# Patient Record
Sex: Female | Born: 2005 | Race: White | Hispanic: Yes | Marital: Single | Smoking: Never smoker
Health system: Southern US, Community
[De-identification: ages and names within clinical notes are randomized; demographics above are authoritative.]

---
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
## 2025-01-10 ENCOUNTER — Encounter (HOSPITAL_BASED_OUTPATIENT_CLINIC_OR_DEPARTMENT_OTHER): Payer: Self-pay | Admitting: Emergency Medicine

## 2025-01-10 ENCOUNTER — Emergency Department (HOSPITAL_BASED_OUTPATIENT_CLINIC_OR_DEPARTMENT_OTHER): Admission: EM | Admit: 2025-01-10 | Discharge: 2025-01-10 | Disposition: A | Discharge status: Home / Self Care

## 2025-01-10 ENCOUNTER — Other Ambulatory Visit: Payer: Self-pay

## 2025-01-10 DIAGNOSIS — N946 Dysmenorrhea, unspecified: Secondary | ICD-10-CM | POA: Insufficient documentation

## 2025-01-10 DIAGNOSIS — J45909 Unspecified asthma, uncomplicated: Secondary | ICD-10-CM | POA: Diagnosis not present

## 2025-01-10 LAB — CBC WITH DIFFERENTIAL/PLATELET
Abs Immature Granulocytes: 0.05 10*3/uL (ref 0.00–0.07)
Basophils Absolute: 0 10*3/uL (ref 0.0–0.1)
Basophils Relative: 0 %
Eosinophils Absolute: 0.1 10*3/uL (ref 0.0–0.5)
Eosinophils Relative: 2 %
HCT: 35.3 % — ABNORMAL LOW (ref 36.0–46.0)
Hemoglobin: 12.1 g/dL (ref 12.0–15.0)
Immature Granulocytes: 1 %
Lymphocytes Relative: 32 %
Lymphs Abs: 2 10*3/uL (ref 0.7–4.0)
MCH: 29.4 pg (ref 26.0–34.0)
MCHC: 34.3 g/dL (ref 30.0–36.0)
MCV: 85.7 fL (ref 80.0–100.0)
Monocytes Absolute: 0.5 10*3/uL (ref 0.1–1.0)
Monocytes Relative: 7 %
Neutro Abs: 3.6 10*3/uL (ref 1.7–7.7)
Neutrophils Relative %: 58 %
Platelets: 253 10*3/uL (ref 150–400)
RBC: 4.12 MIL/uL (ref 3.87–5.11)
RDW: 12.1 % (ref 11.5–15.5)
WBC: 6.2 10*3/uL (ref 4.0–10.5)
nRBC: 0 % (ref 0.0–0.2)

## 2025-01-10 LAB — URINALYSIS, ROUTINE W REFLEX MICROSCOPIC
Bacteria, UA: NONE SEEN
Bilirubin Urine: NEGATIVE
Glucose, UA: NEGATIVE mg/dL
Ketones, ur: NEGATIVE mg/dL
Nitrite: NEGATIVE
RBC / HPF: >50 RBC/hpf (ref 0–5)
Specific Gravity, Urine: 1.021 (ref 1.005–1.030)
pH: 6 (ref 5.0–8.0)

## 2025-01-10 LAB — COMPREHENSIVE METABOLIC PANEL WITH GFR
ALT: 10 U/L (ref 0–44)
AST: 17 U/L (ref 15–41)
Albumin: 4.6 g/dL (ref 3.5–5.0)
Alkaline Phosphatase: 59 U/L (ref 38–126)
Anion gap: 12 (ref 5–15)
BUN: 10 mg/dL (ref 6–20)
CO2: 25 mmol/L (ref 22–32)
Calcium: 10 mg/dL (ref 8.9–10.3)
Chloride: 104 mmol/L (ref 98–111)
Creatinine, Ser: 0.56 mg/dL (ref 0.44–1.00)
GFR, Estimated: >60 mL/min
Glucose, Bld: 97 mg/dL (ref 70–99)
Potassium: 3.8 mmol/L (ref 3.5–5.1)
Sodium: 141 mmol/L (ref 135–145)
Total Bilirubin: 0.3 mg/dL (ref 0.0–1.2)
Total Protein: 7.2 g/dL (ref 6.5–8.1)

## 2025-01-10 LAB — MAGNESIUM: Magnesium: 2.2 mg/dL (ref 1.7–2.4)

## 2025-01-10 LAB — PREGNANCY, URINE: Preg Test, Ur: NEGATIVE

## 2025-01-10 MED ORDER — KETOROLAC TROMETHAMINE 15 MG/ML IJ SOLN
15.0000 mg | Freq: Once | INTRAMUSCULAR | Status: AC
  Administered 2025-01-10: 15 mg via INTRAVENOUS
  Filled 2025-01-10: qty 1

## 2025-01-10 MED ORDER — ETODOLAC 400 MG PO TABS: 400.0000 mg | ORAL_TABLET | Freq: Two times a day (BID) | ORAL | 0 refills | Status: AC

## 2025-01-10 MED ORDER — SODIUM CHLORIDE 0.9 % IV BOLUS
1000.0000 mL | Freq: Once | INTRAVENOUS | Status: AC
  Administered 2025-01-10: 1000 mL via INTRAVENOUS

## 2025-01-10 NOTE — ED Provider Notes (Signed)
 " [facility] EMERGENCY DEPARTMENT AT [facility] Provider Note   CSN: [id number] Arrival date & time: 01/10/25  9150     Patient presents with: Abdominal Cramping   Tracie Patterson is a 19 y.o. female.   19 year old female presents with her mom for concern of menstrual cramps that have been ongoing for the past 3 days that are causing intense pain.  She states this has been at a problem since she started having cycles.  She states every year they have been getting more intense.  She had notified her pediatrician who told her that these were normal in the past.  However she states the past several cycles her cramps have been pretty significant.  The cycle that started 3 days ago she has been passing blood clots.  She does endorse weakness, cramps, and lightheadedness.  No chest pain.  She does not have a GYN.  Her mom states that she was diagnosed with endometriosis when she was 19 years old.  The patient denies vaginal discharge, being sexually active, fever, emesis.  Her abdominal cramps are located across the lower abdomen.  The history is provided by the patient. No language interpreter was used.       Prior to Admission medications  Medication Sig Start Date End Date Taking? Authorizing Provider  adapalene  (DIFFERIN ) 0.1 % cream Apply topically at bedtime. Patient not taking: Reported on 11/17/2023 10/19/22   Gabriella Arthor GAILS, MD  Adapalene -Benzoyl Peroxide 0.1-2.5 % gel Apply 1 Application topically at bedtime. 11/28/24   Gabriella Arthor GAILS, MD  azelastine  (OPTIVAR ) 0.05 % ophthalmic solution Place 1 drop into both eyes daily. 11/21/24   Gabriella Arthor GAILS, MD  cetirizine  (ZYRTEC ) 10 MG tablet Take 1 tablet (10 mg total) by mouth daily. PRN Patient not taking: Reported on 11/17/2023 01/20/23   Gabriella Arthor GAILS, MD  Cholecalciferol (VITAMIN D ) 50 MCG (2000 UT) CAPS Take 1 capsule (2,000 Units total) by mouth daily. Start after completing 8 weeks of high dose Vitamin D  treatment  11/30/23   Gabriella Arthor GAILS, MD  hydrOXYzine  (ATARAX ) 10 MG tablet Take 1 tablet (10 mg total) by mouth 3 (three) times daily as needed. 11/17/23   Shropshire, Beatriz, DO  minocycline  (MINOCIN ) 100 MG capsule Take 1 capsule (100 mg total) by mouth 2 (two) times daily. 11/21/24   Simha, Arthor GAILS, MD  Vitamin D , Ergocalciferol , (DRISDOL ) 1.25 MG (50000 UNIT) CAPS capsule Take 1 capsule (50,000 Units total) by mouth every 7 (seven) days. 11/30/23   Gabriella Arthor GAILS, MD    Allergies: Patient has no known allergies.    Review of Systems  Constitutional:  Negative for chills and fever.  Respiratory:  Negative for shortness of breath.   Cardiovascular:  Negative for chest pain.  Gastrointestinal:  Positive for abdominal pain. Negative for nausea and vomiting.  Genitourinary:  Positive for vaginal bleeding. Negative for difficulty urinating, dysuria and vaginal discharge.  Neurological:  Positive for light-headedness.  All other systems reviewed and are negative.   Updated Vital Signs BP 116/72   Pulse 63   Temp 98.3 F (36.8 C) (Oral)   Resp 18   Ht 5' 4 (1.626 m)   Wt 72.6 kg   LMP 01/08/2025 (Exact Date)   SpO2 100%   BMI 27.46 kg/m   Physical Exam Vitals and nursing note reviewed.  Constitutional:      General: She is not in acute distress.    Appearance: Normal appearance. She is not ill-appearing.  HENT:  Head: Normocephalic and atraumatic.  Abdominal:     General: There is no distension.     Palpations: Abdomen is soft.     Tenderness: There is abdominal tenderness. There is no right CVA tenderness, left CVA tenderness or guarding.  Neurological:     Mental Status: She is alert.     (all labs ordered are listed, but only abnormal results are displayed) Labs Reviewed  URINALYSIS, ROUTINE W REFLEX MICROSCOPIC - Abnormal; Notable for the following components:      Result Value   Color, Urine ORANGE (*)    APPearance HAZY (*)    Hgb urine dipstick LARGE (*)     Protein, ur TRACE (*)    Leukocytes,Ua TRACE (*)    All other components within normal limits  PREGNANCY, URINE  CBC WITH DIFFERENTIAL/PLATELET  COMPREHENSIVE METABOLIC PANEL WITH GFR  MAGNESIUM    EKG: None  Radiology: No results found.   Procedures   Medications Ordered in the ED  sodium chloride  0.9 % bolus 1,000 mL (has no administration in time range)  ketorolac  (TORADOL ) 15 MG/ML injection 15 mg (has no administration in time range)                                    Medical Decision Making Amount and/or Complexity of Data Reviewed Labs: ordered.  Risk Prescription drug management.   Medical Decision Making / ED Course   This patient presents to the ED for concern of abdominal pain, this involves an extensive number of treatment options, and is a complaint that carries with it a high risk of complications and morbidity.  The differential diagnosis includes menstrual cramps, ovarian torsion, UTI, pyelonephritis, colitis, gastroenteritis, appendicitis, endometriosis, PCOS  MDM: 19 year old female presents with above image complaints.  Overall she is currently well-appearing.  Does have tenderness across the lower abdomen.  UA without evidence of UTI but does show some hemoglobin which is consistent with her menstrual cycle. Will provide fluids, Toradol , obtain blood work.  Pregnancy test is negative.  She does have history of irregular cycles.  She notes that she has been going through 5 pads a day this cycle.  Normally she goes through 3 pads a day.  CBC without leukocytosis or anemia.  CMP unremarkable.  Magnesium within normal. Patient feels improved after fluids and Toradol .  No fevers or chills given the duration of symptoms and that this similar pain occurs every menstrual cycle this is likely menstrual cramp pain.  She is comfortable on reevaluation.  We discussed close follow-up with GYN.  We discussed strict return precautions specially signs or symptoms that  may be worrisome for ovarian torsion.  Patient voices understanding and she is in agreement with the plan.  Patient discharged in stable condition.   Additional history obtained: -Additional history obtained from mom at bedside -External records from outside source obtained and reviewed including: Chart review including previous notes, labs, imaging, consultation notes   Lab Tests: -I ordered, reviewed, and interpreted labs.   The pertinent results include:   Labs Reviewed  URINALYSIS, ROUTINE W REFLEX MICROSCOPIC - Abnormal; Notable for the following components:      Result Value   Color, Urine ORANGE (*)    APPearance HAZY (*)    Hgb urine dipstick LARGE (*)    Protein, ur TRACE (*)    Leukocytes,Ua TRACE (*)    All other components within normal limits  PREGNANCY, URINE  CBC WITH DIFFERENTIAL/PLATELET  COMPREHENSIVE METABOLIC PANEL WITH GFR  MAGNESIUM      EKG  EKG Interpretation Date/Time:    Ventricular Rate:    PR Interval:    QRS Duration:    QT Interval:    QTC Calculation:   R Axis:      Text Interpretation:           Imaging Studies ordered: I ordered imaging studies including none I independently visualized and interpreted imaging. I agree with the radiologist interpretation   Medicines ordered and prescription drug management: Meds ordered this encounter  Medications   sodium chloride  0.9 % bolus 1,000 mL   ketorolac  (TORADOL ) 15 MG/ML injection 15 mg    -I have reviewed the patients home medicines and have made adjustments as needed  Social Determinants of Health:  Factors impacting patients care include: no gyn follow up. Referral given   Reevaluation: After the interventions noted above, I reevaluated the patient and found that they have :improved  Co morbidities that complicate the patient evaluation  Past Medical History:  Diagnosis Date   Asthma       Dispostion: Discharged in stable condition.  Return precaution discussed.   Patient voices understanding and is in agreement with plan.  Final diagnoses:  Severe menstrual cramps    ED Discharge Orders          Ordered    etodolac  (LODINE ) 400 MG tablet  2 times daily        01/10/25 1050               Hildegard Loge, [place] 01/10/25 1056    Simon Lavonia SAILOR, MD 01/10/25 1225  "

## 2025-01-10 NOTE — ED Triage Notes (Signed)
 Pt caox4, ambulatory reporting she started her menstrual cycle 3 days ago and that the lower abd cramping feels worse than it does with her previous menstrual cycles. Also reports she has had more clots this times with some nausea.

## 2025-01-10 NOTE — Discharge Instructions (Signed)
 Blood work in the emergency department was reassuring.  You received a dose of Toradol  which is an anti-inflammatory medication which helped your pain.  I will send Lodine  which is an anti-inflammatory medication into the pharmacy.  Do not take ibuprofen  or Aleve while you are taking this medication.  If this medication is expensive do not pick this up and take Aleve 500 mg twice a day instead.  Please follow-up with the GYN.  I have listed their office information above.  Return for any worsening

## 2025-01-16 ENCOUNTER — Ambulatory Visit: Admitting: Pediatrics

## 2025-01-16 VITALS — Temp 98.4°F | Wt 163.4 lb

## 2025-01-16 DIAGNOSIS — N946 Dysmenorrhea, unspecified: Secondary | ICD-10-CM | POA: Diagnosis not present

## 2025-01-16 DIAGNOSIS — L709 Acne, unspecified: Secondary | ICD-10-CM | POA: Diagnosis not present

## 2025-01-16 MED ORDER — ADAPALENE 0.3 % EX GEL: 1.0000 | Freq: Every day | CUTANEOUS | 6 refills | Status: AC

## 2025-01-16 MED ORDER — MINOCYCLINE HCL 100 MG PO CAPS: 100.0000 mg | ORAL_CAPSULE | Freq: Two times a day (BID) | ORAL | 3 refills | Status: AC

## 2025-01-16 MED ORDER — NAPROXEN 375 MG PO TABS: 375.0000 mg | ORAL_TABLET | Freq: Two times a day (BID) | ORAL | 3 refills | Status: AC

## 2025-01-16 NOTE — Patient Instructions (Signed)
 Cramps During Your Menstrual Period Cramps during your period is called dysmenorrhea. The cramps cause pain in your lower belly. In some cases, the pain may not be that bad. In other cases, the pain may be so bad that it can affect your daily life for a few days each month. There're two types of this condition: Primary. This happens when a female has their first monthly period or soon after. As a person gets older or has a baby, the cramps usually lessen or go away. Secondary. This type begins later in life. It's caused by a problem in the reproductive system. This type lasts longer. The pain may also be worse than in primary type. What are the causes? Primary dysmenorrhea is caused by hormone changes during your period.  Common causes of secondary dysmenorrhea include: Problems with the tissue that lines the uterus. This tissue may: Grow outside the uterus. Grow into the walls of the uterus. Growths in the uterus that are not cancer (fibroids). Problems with the reproductive organs. These may include problems with the uterus, fallopian tubes, or ovaries. Infection. What increases the risk? You are more likely to get this condition if: You are younger than 19 years old. You had your first period before you were 19 years old. You have irregular or heavy bleeding. You have never given birth. You have family members with the condition. You smoke or use nicotine products. You have high body weight or a low body weight. What are the signs or symptoms? Symptoms of this condition may include: Cramps and pain in the lower belly or lower back. Headaches. Throwing up or feeling like you may throw up. Watery poop (diarrhea) or loose poop. Feeling dizzy. Feeling tired. How is this diagnosed? This condition may be diagnosed based on your symptoms, medical history, and a physical exam. You may also have other tests, including: Imaging tests, such as ultrasound, X-ray, CT scan, or MRI. Blood  tests. A pregnancy test. A pap test. Taking out and testing a small piece of the tissue that lines the uterus. A procedure that uses a device with a camera to look inside the uterus, pelvis, or bladder. How is this treated? Treatment depends on the cause of your condition. You may be given: Medicines for pain. Hormones. These include shots to stop your periods. You may also get hormones through birth control pills or an IUD. Antidepressant medicines. Antibiotics. These are given if your cramps are being caused by an infection. Other treatment may include: Exercise and physical therapy. Meditation, yoga, and acupuncture. Stimulating the nerves that go to the bottom of the spine. If other treatments don't work, your health care provider may suggest having surgery. This is rare. Work with your provider to see which treatment is best for you. Follow these instructions at home: Relieving pain and cramping  Use heat as told. Put the heat on your lower back or belly when you have pain or cramps. Use the heat source that your provider recommends, such as a moist heat pack or a heating pad. Do this as often as told. Place a towel between your skin and the heat source. Leave the heat on for 20-30 minutes. If your skin turns red, take off the heat right away to prevent burns. The risk of burns is higher if you can't feel pain, heat, or cold. Do not sleep with a heating pad on. Exercise as told. Activities such as walking, swimming, or biking can help to relieve pain. Massage your lower back  or belly to help relieve pain. General instructions Take your medicines only as told. Ask your provider if it's safe to drive or use machines while taking your medicine. Avoid alcohol and caffeine during and right before your period. These can make cramps worse. Do not smoke, vape, or use nicotine or tobacco. Keep all follow-up visits. Your provider can change your treatment plan if treatment isn't  working. Contact a health care provider if: You have symptoms that get worse or do not get better with medicine. You have new symptoms. This information is not intended to replace advice given to you by your health care provider. Make sure you discuss any questions you have with your health care provider. Document Revised: 10/11/2023 Document Reviewed: 05/24/2023 Elsevier Patient Education  2024 [employer].

## 2025-01-16 NOTE — Progress Notes (Signed)
 "   Subjective:    Tracie Patterson is a 19 y.o. female a presenting to the clinic today for ER follow-up for dysmenorrhea.  Patient was seen in the emergency room on 01/10/2025 for severe menstrual cramps that was not responding to over-the-counter ibuprofen  and Midol .  Patient had significant cramping as well as leg pain and was not able to walk due to the pain.  She received IV fluids and Toradol  in the ER which seem to relieve her pain.  She had normal CMP and CBC and negative pregnancy test during her ER visit. Patient reports that her pain has resolved since she has completed her menstrual cycle but it seems that overall her menstrual cramps have worsened over time and she often has to miss school or work when she has significant menstrual cramping.  Usually uses ibuprofen  or Midol  at home but that has not been helpful.  There have been several discussions regarding hormone therapy in the past but patient has been very hesitant.  She is not sexually active.  Patient also has history of acne and has been on minocycline  and topical antiacne medications.  She says that she has flareup of acne during her menstrual cycle. Maternal history of endometriosis.  Review of Systems  Genitourinary:  Positive for menstrual problem. Negative for dysuria and vaginal discharge.       Objective:   Physical Exam Vitals and nursing note reviewed.  Constitutional:      General: She is not in acute distress. HENT:     Head: Normocephalic and atraumatic.     Right Ear: External ear normal.     Left Ear: External ear normal.     Nose: Nose normal.  Eyes:     General:        Right eye: No discharge.        Left eye: No discharge.     Conjunctiva/sclera: Conjunctivae normal.  Cardiovascular:     Rate and Rhythm: Normal rate and regular rhythm.     Heart sounds: Normal heart sounds.  Pulmonary:     Effort: No respiratory distress.     Breath sounds: No wheezing or rales.  Musculoskeletal:      Cervical back: Normal range of motion.  Skin:    General: Skin is warm and dry.     Findings: Rash (Acneform lesions on cheeks and chin) present.    .Temp 98.4 F (36.9 C) (Tympanic)   Wt 163 lb 6.4 oz (74.1 kg)   LMP 01/08/2025 (Exact Date)   BMI 28.05 kg/m         Assessment & Plan:  1. Dysmenorrhea (Primary) Patient with significant menstrual cramping that at times interferes with school and work.  Patient likely has PCOS due to facial acne and severe cramping during menstrual cycles.  She however has regular cycles. Discussed with patient obtaining further labs for PCOS but lab unavailable today.  Also discussed management plan with hormone therapy in the form of oral contraceptive pills, medroxyprogesterone shots or other LARC options such as Nexplanon or IUD. Patient is very hesitant to start any hormone therapy and also feels her family is opposed to hormone treatment.  Discussed in detail that this would be similar to medical management of an illness and to weigh the pros and cons as the menstrual cramping is causing her to miss school and work. Patient is requesting a referral to GYN for further workup and will decide her options after further PCOS workup and ultrasound Referral  placed to GYN  2. Acne, unspecified acne type Skin care and acne plan discussed - minocycline  (MINOCIN ) 100 MG capsule; Take 1 capsule (100 mg total) by mouth 2 (two) times daily.  Dispense: 31 capsule; Refill: 3 - Adapalene  0.3 % gel; Apply 1 Application topically at bedtime.  Dispense: 45 g; Refill: 6   Time spent reviewing chart in preparation for visit:  5 minutes Time spent face-to-face with patient: 25 minutes Time spent not face-to-face with patient for documentation and care coordination on date of service: 5 minutes  Return in about 3 months (around 04/15/2025) for Recheck with Dr Gabriella.  Arthor Gabriella, MD 01/16/2025 3:11 PM  "

## 2025-01-30 ENCOUNTER — Ambulatory Visit: Admitting: Obstetrics and Gynecology

## 2025-04-15 ENCOUNTER — Ambulatory Visit: Admitting: Pediatrics
